# Patient Record
Sex: Male | Born: 1960 | Race: Black or African American | Hispanic: No | Marital: Married | Smoking: Never smoker
Health system: Southern US, Community
[De-identification: ages and names within clinical notes are randomized; demographics above are authoritative.]

## PROBLEM LIST (undated history)

## (undated) DIAGNOSIS — E7800 Pure hypercholesterolemia, unspecified: Secondary | ICD-10-CM

## (undated) DIAGNOSIS — I10 Essential (primary) hypertension: Secondary | ICD-10-CM

---

## 2016-06-16 ENCOUNTER — Emergency Department (HOSPITAL_BASED_OUTPATIENT_CLINIC_OR_DEPARTMENT_OTHER)
Admission: EM | Admit: 2016-06-16 | Discharge: 2016-06-16 | Disposition: A | Discharge status: Home / Self Care | Payer: Self-pay | Attending: Emergency Medicine | Admitting: Emergency Medicine

## 2016-06-16 ENCOUNTER — Encounter (HOSPITAL_BASED_OUTPATIENT_CLINIC_OR_DEPARTMENT_OTHER): Payer: Self-pay | Admitting: *Deleted

## 2016-06-16 DIAGNOSIS — S0990XA Unspecified injury of head, initial encounter: Secondary | ICD-10-CM

## 2016-06-16 DIAGNOSIS — Y999 Unspecified external cause status: Secondary | ICD-10-CM | POA: Insufficient documentation

## 2016-06-16 DIAGNOSIS — I10 Essential (primary) hypertension: Secondary | ICD-10-CM | POA: Insufficient documentation

## 2016-06-16 DIAGNOSIS — Z79899 Other long term (current) drug therapy: Secondary | ICD-10-CM | POA: Insufficient documentation

## 2016-06-16 DIAGNOSIS — Y939 Activity, unspecified: Secondary | ICD-10-CM | POA: Insufficient documentation

## 2016-06-16 DIAGNOSIS — Y92410 Unspecified street and highway as the place of occurrence of the external cause: Secondary | ICD-10-CM | POA: Insufficient documentation

## 2016-06-16 DIAGNOSIS — S060X0A Concussion without loss of consciousness, initial encounter: Secondary | ICD-10-CM | POA: Insufficient documentation

## 2016-06-16 HISTORY — DX: Pure hypercholesterolemia, unspecified: E78.00

## 2016-06-16 HISTORY — DX: Essential (primary) hypertension: I10

## 2016-06-16 MED ORDER — CYCLOBENZAPRINE HCL 10 MG PO TABS: 10.0000 mg | ORAL_TABLET | Freq: Two times a day (BID) | ORAL | 0 refills | Status: AC | PRN

## 2016-06-16 MED ORDER — HYDROCODONE-ACETAMINOPHEN 5-325 MG PO TABS: 1.0000 | ORAL_TABLET | Freq: Four times a day (QID) | ORAL | 0 refills | Status: AC | PRN

## 2016-06-16 MED FILL — CYCLOBENZAPRINE 10 MG TAB: 10 | 10 days supply | Qty: 20 | Fill #0

## 2016-06-16 MED FILL — HYDROCODON-APAP 5-325: 5-325 | 2 days supply | Qty: 5 | Fill #0
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
## 2019-01-02 ENCOUNTER — Other Ambulatory Visit: Payer: Self-pay

## 2019-09-26 ENCOUNTER — Encounter (HOSPITAL_BASED_OUTPATIENT_CLINIC_OR_DEPARTMENT_OTHER): Payer: Self-pay | Admitting: Emergency Medicine

## 2019-09-26 ENCOUNTER — Emergency Department (HOSPITAL_BASED_OUTPATIENT_CLINIC_OR_DEPARTMENT_OTHER): Payer: PRIVATE HEALTH INSURANCE

## 2019-09-26 ENCOUNTER — Other Ambulatory Visit: Payer: Self-pay

## 2019-09-26 ENCOUNTER — Emergency Department (HOSPITAL_BASED_OUTPATIENT_CLINIC_OR_DEPARTMENT_OTHER)
Admission: EM | Admit: 2019-09-26 | Discharge: 2019-09-26 | Disposition: A | Discharge status: Home / Self Care | Payer: PRIVATE HEALTH INSURANCE | Attending: Emergency Medicine | Admitting: Emergency Medicine

## 2019-09-26 DIAGNOSIS — E782 Mixed hyperlipidemia: Secondary | ICD-10-CM | POA: Diagnosis not present

## 2019-09-26 DIAGNOSIS — M25511 Pain in right shoulder: Secondary | ICD-10-CM

## 2019-09-26 DIAGNOSIS — Z79899 Other long term (current) drug therapy: Secondary | ICD-10-CM | POA: Diagnosis not present

## 2019-09-26 DIAGNOSIS — Z880 Allergy status to penicillin: Secondary | ICD-10-CM | POA: Diagnosis not present

## 2019-09-26 DIAGNOSIS — I10 Essential (primary) hypertension: Secondary | ICD-10-CM | POA: Insufficient documentation

## 2019-09-26 NOTE — Discharge Instructions (Signed)
Patient x-ray shows no acute fractures or malalignment.  Some arthritis changes.  Recommend Tylenol and Voltaren and follow-up with primary care doctor or sports medicine.

## 2019-09-26 NOTE — ED Provider Notes (Signed)
[facility] EMERGENCY DEPARTMENT Provider Note   CSN: [id number] Arrival date & time: 09/26/19  1037     History   Chief Complaint Chief Complaint  Patient presents with  . Shoulder Injury    HPI Darryl Webb is a 58 y.o. male.     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  Denies any chest pain, shortness of breath.  Has been using Tylenol with some relief.  Got some Voltaren gel today but has not used it yet.  The history is provided by the patient.  Shoulder Injury This is a new problem. The current episode started more than 2 days ago. The problem occurs daily. The problem has not changed since onset.Pertinent negatives include no chest pain, no abdominal pain, no headaches and no shortness of breath. Exacerbated by: movement. He has tried acetaminophen for the symptoms. The treatment provided mild relief.    Past Medical History:  Diagnosis Date  . Hypercholesteremia   . Hypertension     There are no active problems to display for this patient.   History reviewed. No pertinent surgical history.      Home Medications    Prior to Admission medications   Medication Sig Start Date End Date Taking? Authorizing Provider  ATORVASTATIN CALCIUM PO Take by mouth.   Yes [provider]  METFORMIN HCL PO Take by mouth.   Yes [provider]  cyclobenzaprine (FLEXERIL) 10 MG tablet Take 1 tablet (10 mg total) by mouth 2 (two) times daily as needed for muscle spasms. 06/16/16   Roxy Horseman, PA-C  hydrochlorothiazide (HYDRODIURIL) 25 MG tablet Take 25 mg by mouth daily.    [provider]  HYDROcodone-acetaminophen (NORCO/VICODIN) 5-325 MG tablet Take 1 tablet by mouth every 6 (six) hours as needed. 06/16/16   Roxy Horseman, PA-C    Family History History reviewed. No pertinent family history.  Social History Social History   Tobacco Use  . Smoking  status: Never Smoker  . Smokeless tobacco: Never Used  Substance Use Topics  . Alcohol use: Never    Frequency: Never  . Drug use: Never     Allergies   Penicillins   Review of Systems Review of Systems  Constitutional: Negative for chills and fever.  HENT: Negative for ear pain and sore throat.   Eyes: Negative for pain and visual disturbance.  Respiratory: Negative for cough and shortness of breath.   Cardiovascular: Negative for chest pain and palpitations.  Gastrointestinal: Negative for abdominal pain and vomiting.  Genitourinary: Negative for dysuria and hematuria.  Musculoskeletal: Negative for arthralgias and back pain.  Skin: Negative for color change and rash.  Neurological: Negative for seizures, syncope and headaches.  All other systems reviewed and are negative.    Physical Exam Updated Vital Signs BP (!) 156/100 (BP Location: Left Arm)   Pulse 86   Temp 98.4 F (36.9 C) (Oral)   Resp 16   Ht 5\' 9"  (1.753 m)   Wt (!) 143.8 kg   SpO2 98%   BMI 46.81 kg/m   Physical Exam Vitals signs and nursing note reviewed.  Constitutional:      Appearance: He is well-developed.  HENT:     Head: Normocephalic and atraumatic.  Eyes:     Conjunctiva/sclera: Conjunctivae normal.  Neck:     Musculoskeletal: Normal range of motion and neck supple.  Cardiovascular:     Rate and Rhythm: Normal rate  and regular rhythm.     Pulses: Normal pulses.     Heart sounds: Normal heart sounds. No murmur.  Pulmonary:     Effort: Pulmonary effort is normal. No respiratory distress.     Breath sounds: Normal breath sounds.  Abdominal:     Palpations: Abdomen is soft.     Tenderness: There is no abdominal tenderness.  Musculoskeletal: Normal range of motion.        General: Tenderness present.     Comments: Normal range of motion of the right shoulder, tenderness with pain with range of motion, tenderness within the shoulder area  Skin:    General: Skin is warm and dry.   Neurological:     General: No focal deficit present.     Mental Status: He is alert.     Sensory: No sensory deficit.     Comments: 5+ out of 5 strength through bilateral upper extremities, normal sensation throughout bilateral upper extremities      ED Treatments / Results  Labs (all labs ordered are listed, but only abnormal results are displayed) Labs Reviewed - No data to display  EKG EKG Interpretation  Date/Time:  Wednesday September 26 2019 10:59:32 EST Ventricular Rate:  87 PR Interval:    QRS Duration: 80 QT Interval:  375 QTC Calculation: 452 R Axis:   11 Text Interpretation: Sinus rhythm Confirmed by Lennice Sites [phone]) on 09/26/2019 11:10:48 AM   Radiology Dg Shoulder Right  Result Date: 09/26/2019 CLINICAL DATA:  Right shoulder pain since Sunday. EXAM: RIGHT SHOULDER - 2+ VIEW COMPARISON:  None. FINDINGS: Moderate glenohumeral joint degenerative changes with joint space narrowing and inferior spurring. No acute bony findings or abnormal soft tissue calcifications. Moderate degenerative changes at the [facility] joint. The visualized right ribs are intact and the visualized right lung is clear. IMPRESSION: Moderate glenohumeral joint degenerative changes but no fracture or dislocation. Electronically Signed   By: Marijo Sanes M.D.   On: 09/26/2019 11:24    Procedures Procedures (including critical care time)  Medications Ordered in ED Medications - No data to display   Initial Impression / Assessment and Plan / ED Course  I have reviewed the triage vital signs and the nursing notes.  Pertinent labs & imaging results that were available during my care of the patient were reviewed by me and considered in my medical decision making (see chart for details).     Darryl Webb is a 58 year old male with history of hypertension, high cholesterol presents to the ED with right shoulder pain.  Patient with overall unremarkable vitals.  No fever.  Patient with pain in the  right shoulder over the last several days worse with movement.  Works a Retail buyer job with repetitive movement.  Has good strength and sensation on exam.  Has some tenderness in the right shoulder area.  EKG showed sinus rhythm.  No ischemic changes.  No chest pain.  Doubt cardiac process.  X-ray showed no acute fracture or malalignment of the right shoulder.  Suspect likely some mild inflammation/arthritis.  Less likely ligament or tendon injury.  Recommend continued use of Tylenol, Voltaren.  Given information to follow-up with sports medicine or he can follow-up with his primary care doctor as well.  Discharged in good condition.  Given return precautions.  This chart was dictated using voice recognition software.  Despite best efforts to proofread,  errors can occur which can change the documentation meaning.    Final Clinical Impressions(s) / ED Diagnoses  Final diagnoses:  Acute pain of right shoulder    ED Discharge Orders    None       Virgina NorfolkCuratolo, Guelda Batson, DO 09/26/19 1128

## 2019-09-26 NOTE — ED Notes (Signed)
ED Provider at bedside. 

## 2019-09-26 NOTE — ED Triage Notes (Signed)
Reports right shoulder pain which began Sunday after injury while lifting at work.  States that when he moves his shoulder a certain way it hurts more.  Reports the pain radiates into upper back as well as radiating down the right arm.

## 2021-01-25 IMAGING — CR DG SHOULDER 2+V*R*
4 series · 4 of 4 positions shown · non-contrast
Comparison: None.

CLINICAL DATA: Right shoulder pain since [REDACTED].

EXAM:
RIGHT SHOULDER - 2+ VIEW

[w shoulder grashey right * (1 of 2)]
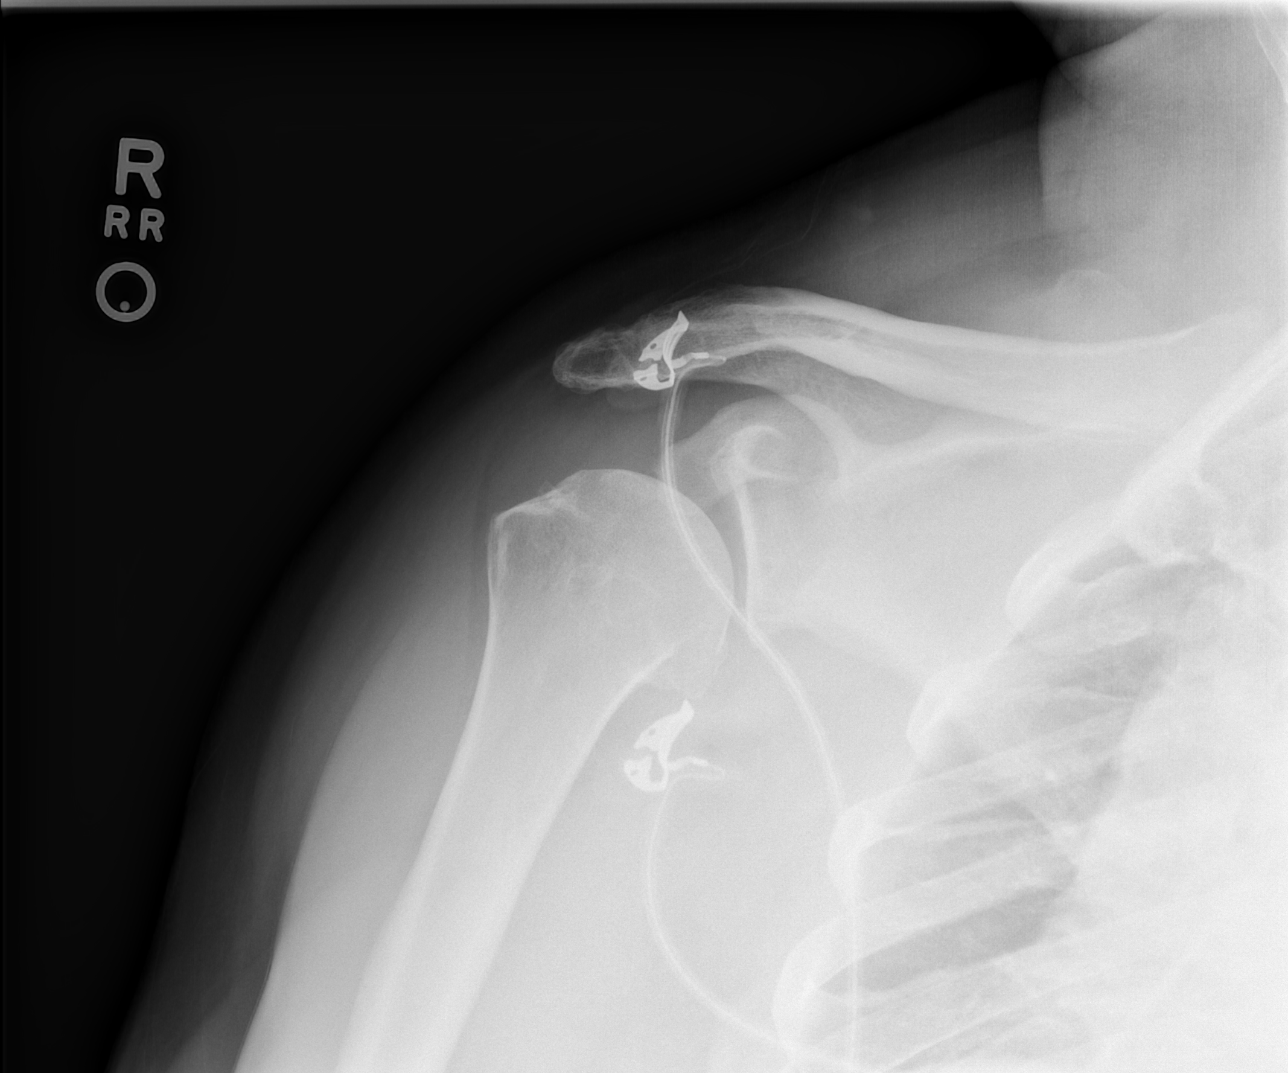

[w shoulder grashey right * (2 of 2)]
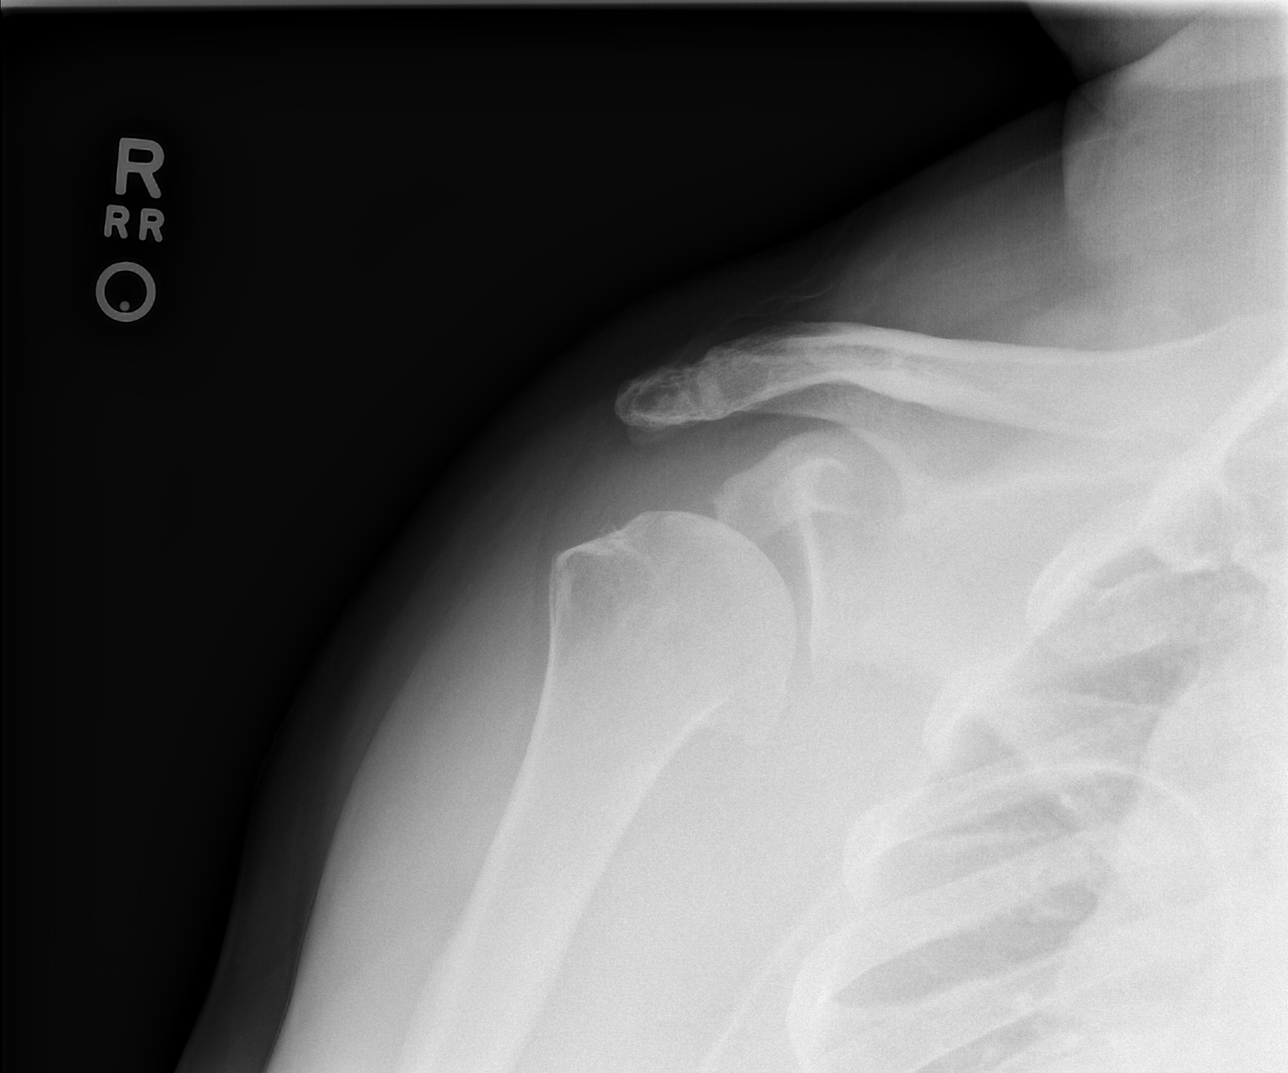

[w shoulder y view right *]
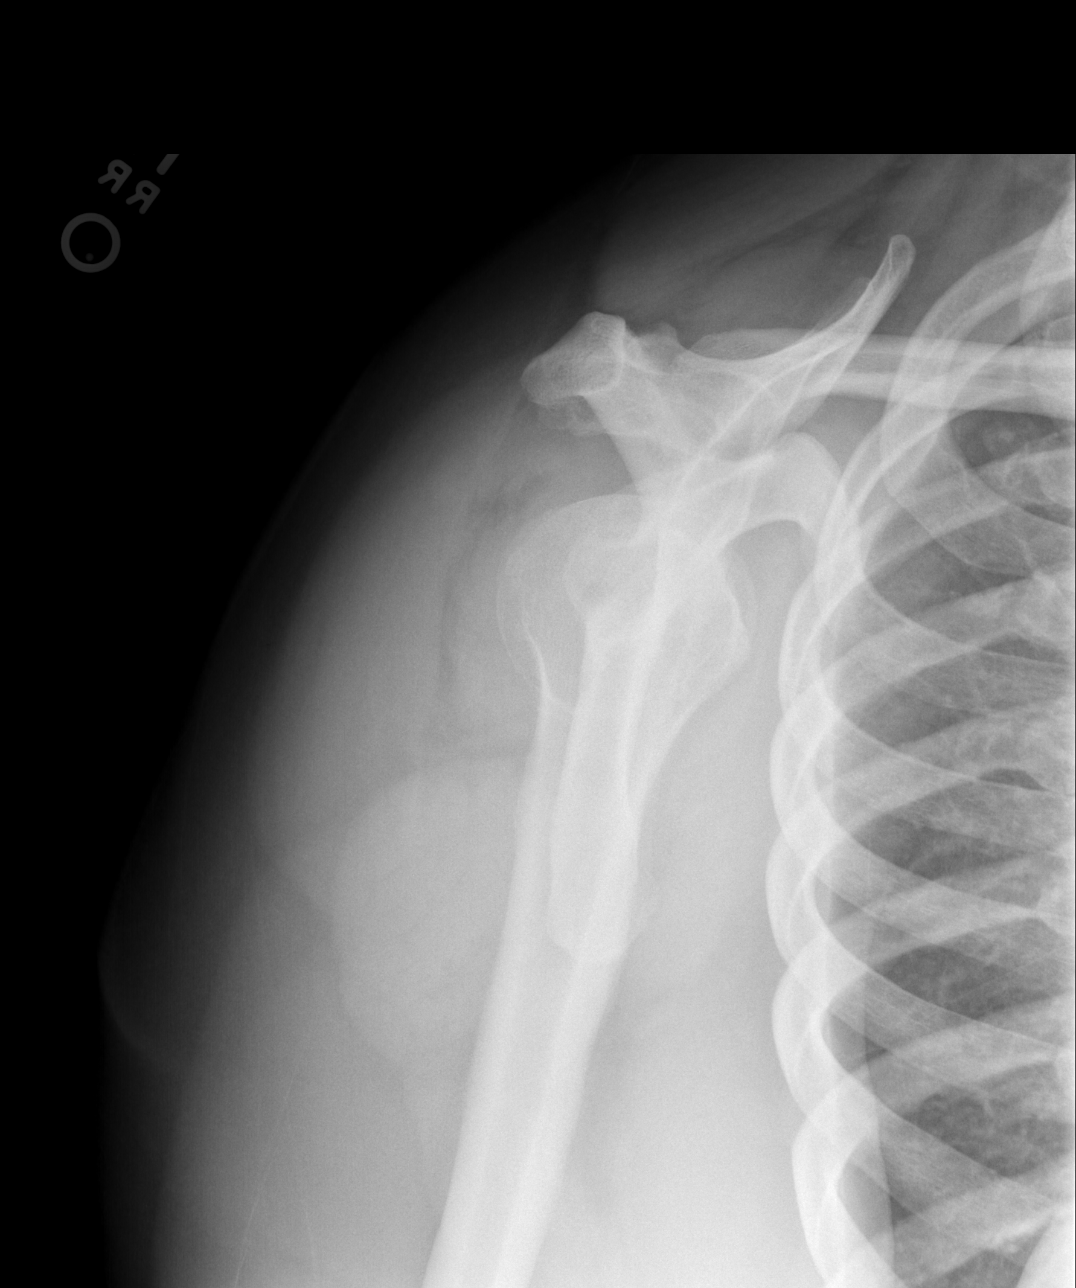

[x shoulder axillary right *]
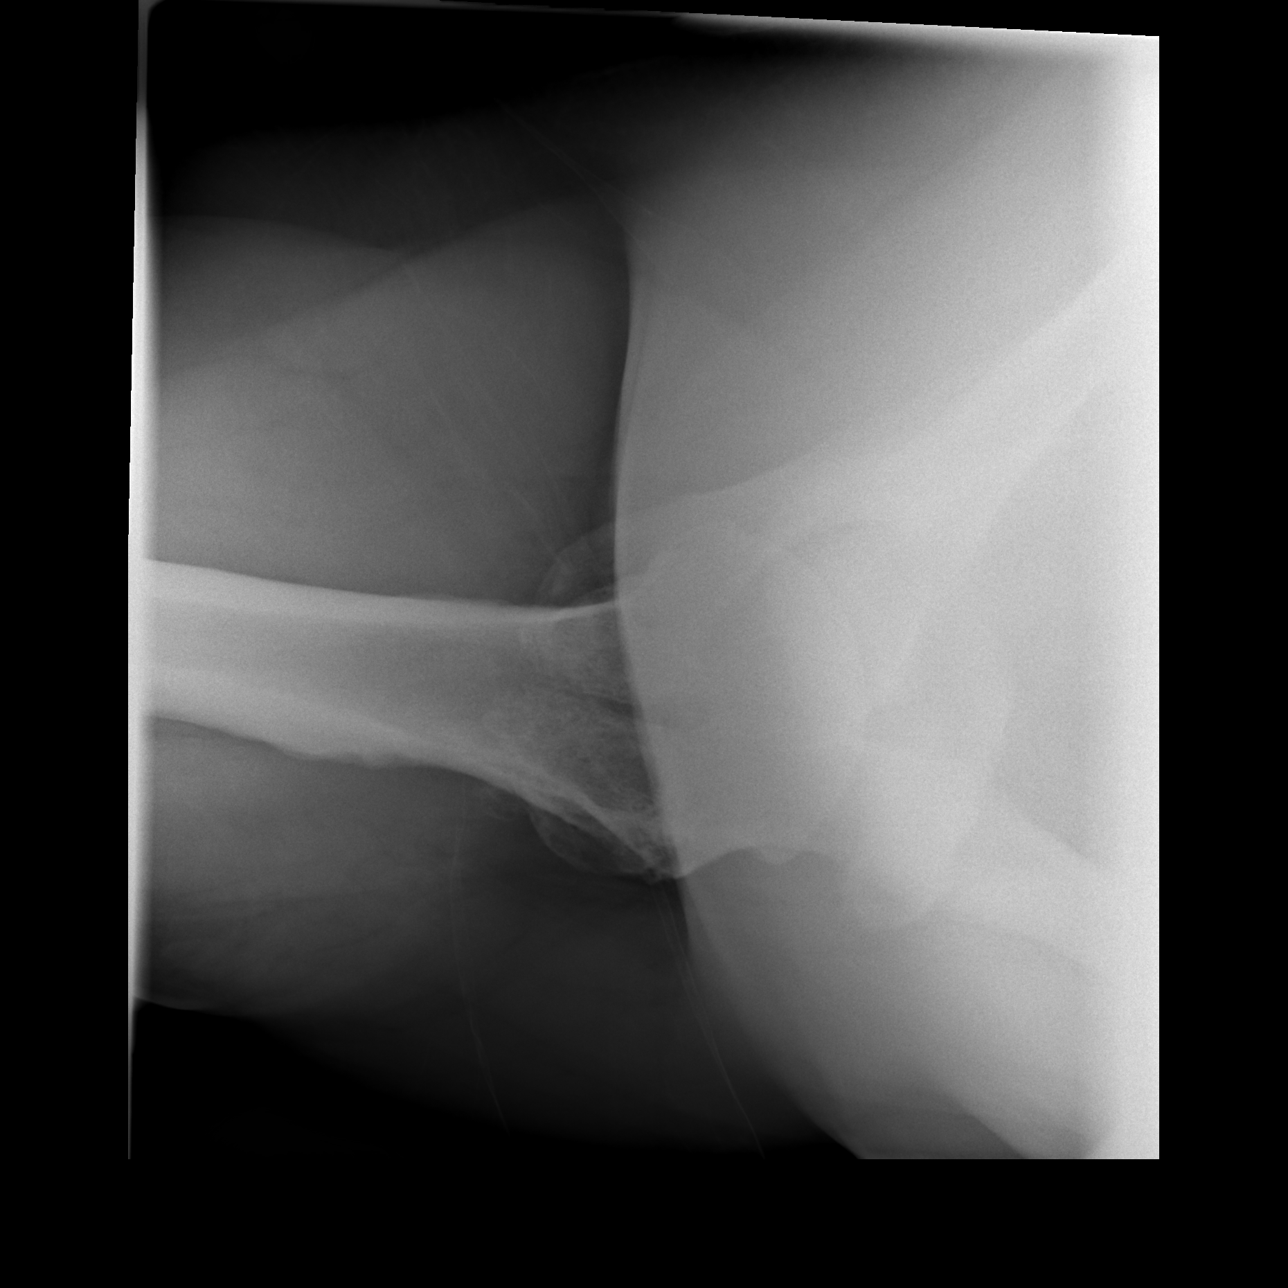

[4 of 4 positions shown; findings below may reference images not displayed]

FINDINGS: Moderate glenohumeral joint degenerative changes with joint space
narrowing and inferior spurring. No acute bony findings or abnormal
soft tissue calcifications.

Moderate degenerative changes at the AC joint. The visualized right
ribs are intact and the visualized right lung is clear.
IMPRESSION: Moderate glenohumeral joint degenerative changes but no fracture or
dislocation.
